# Patient Record
Sex: Male | Born: 1990 | Race: Black or African American | Hispanic: No | Marital: Single
Health system: Southern US, Community
[De-identification: ages and names within clinical notes are randomized; demographics above are authoritative.]

---
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
## 2009-01-15 ENCOUNTER — Emergency Department (HOSPITAL_COMMUNITY): Admission: EM | Admit: 2009-01-15 | Discharge: 2009-01-15 | Payer: Self-pay | Admitting: Family Medicine

## 2009-10-28 ENCOUNTER — Emergency Department (HOSPITAL_COMMUNITY): Admission: EM | Admit: 2009-10-28 | Discharge: 2009-10-28 | Payer: Self-pay | Admitting: Family Medicine

## 2011-08-16 ENCOUNTER — Encounter (HOSPITAL_COMMUNITY): Payer: Self-pay | Admitting: Emergency Medicine

## 2011-08-16 ENCOUNTER — Emergency Department (HOSPITAL_COMMUNITY): Payer: Self-pay

## 2011-08-16 ENCOUNTER — Inpatient Hospital Stay (HOSPITAL_COMMUNITY)
Admission: EM | Admit: 2011-08-16 | Discharge: 2011-08-19 | DRG: 087 | Disposition: A | Discharge status: Home / Self Care | Payer: Self-pay | Attending: Neurosurgery | Admitting: Neurosurgery

## 2011-08-16 DIAGNOSIS — S065X9A Traumatic subdural hemorrhage with loss of consciousness of unspecified duration, initial encounter: Secondary | ICD-10-CM

## 2011-08-16 DIAGNOSIS — S0280XA Fracture of other specified skull and facial bones, unspecified side, initial encounter for closed fracture: Principal | ICD-10-CM | POA: Diagnosis present

## 2011-08-16 DIAGNOSIS — W19XXXA Unspecified fall, initial encounter: Secondary | ICD-10-CM | POA: Diagnosis present

## 2011-08-16 DIAGNOSIS — S0291XA Unspecified fracture of skull, initial encounter for closed fracture: Secondary | ICD-10-CM

## 2011-08-16 DIAGNOSIS — F10929 Alcohol use, unspecified with intoxication, unspecified: Secondary | ICD-10-CM

## 2011-08-16 DIAGNOSIS — S064X9A Epidural hemorrhage with loss of consciousness of unspecified duration, initial encounter: Secondary | ICD-10-CM

## 2011-08-16 LAB — SAMPLE TO BLOOD BANK

## 2011-08-16 LAB — BASIC METABOLIC PANEL
BUN: 12 mg/dL (ref 6–23)
GFR calc non Af Amer: 80 mL/min — ABNORMAL LOW (ref >90)
Glucose, Bld: 85 mg/dL (ref 70–99)

## 2011-08-16 LAB — CBC
Hemoglobin: 15.3 g/dL (ref 13.0–17.0)
MCH: 27.5 pg (ref 26.0–34.0)
MCHC: 33.8 g/dL (ref 30.0–36.0)
MCV: 81.5 fL (ref 78.0–100.0)
Platelets: 237 10*3/uL (ref 150–400)
RBC: 5.56 MIL/uL (ref 4.22–5.81)
RDW: 13.1 % (ref 11.5–15.5)
WBC: 8.9 10*3/uL (ref 4.0–10.5)

## 2011-08-16 LAB — APTT: aPTT: 25 seconds (ref 24–37)

## 2011-08-16 MED ORDER — MORPHINE SULFATE 2 MG/ML IJ SOLN
2.0000 mg | INTRAMUSCULAR | Status: DC | PRN
  Administered 2011-08-17 – 2011-08-19 (×10): 2 mg via INTRAVENOUS
  Filled 2011-08-16 (×10): qty 1

## 2011-08-16 MED ORDER — ONDANSETRON HCL 4 MG PO TABS: 4.0000 mg | ORAL_TABLET | Freq: Four times a day (QID) | ORAL | Status: DC | PRN

## 2011-08-16 MED ORDER — ONDANSETRON HCL 4 MG/2ML IJ SOLN
4.0000 mg | Freq: Four times a day (QID) | INTRAMUSCULAR | Status: DC | PRN
  Administered 2011-08-17 – 2011-08-18 (×2): 4 mg via INTRAVENOUS
  Filled 2011-08-16 (×2): qty 2

## 2011-08-16 MED ORDER — POTASSIUM CHLORIDE IN NACL 20-0.9 MEQ/L-% IV SOLN
INTRAVENOUS | Status: DC
  Administered 2011-08-17 (×2): via INTRAVENOUS
  Filled 2011-08-16: qty 1000

## 2011-08-16 MED ORDER — HYDROCODONE-ACETAMINOPHEN 5-325 MG PO TABS
0.5000 | ORAL_TABLET | ORAL | Status: DC | PRN
  Administered 2011-08-18: 0.5 via ORAL
  Filled 2011-08-16: qty 1

## 2011-08-16 NOTE — ED Notes (Signed)
HOB elevated 45 degrees

## 2011-08-16 NOTE — ED Notes (Signed)
Dr.Cram in to assess

## 2011-08-16 NOTE — ED Notes (Signed)
PA in to reassess; EMS c-collar removed per PA

## 2011-08-16 NOTE — ED Notes (Signed)
Pt. To CT

## 2011-08-16 NOTE — ED Notes (Signed)
Resting quietly on stretcher with eyes closed; opens eyes to name call, mumbles incoherently; PERRLA 3/2; skin dark, warm, dry; ccm showing SR rate 80 without ectopy; EMS collar remains intact; pt remains in supine position; awaiting re-eval by MD; neuro sx has been consulted

## 2011-08-16 NOTE — H&P (Signed)
Adam Booker is an 21 y.o. male.   Chief Complaint: Close injury HPI: Patient is 21 year old gentleman who apparently was intoxicated and fell area unknown LOC patient is mildly amnestic of the event currently not complaining of pain anywhere else in his body.  History reviewed. No pertinent past medical history.  History reviewed. No pertinent past surgical history.  History reviewed. No pertinent family history. Social History:  does not have a smoking history on file. He does not have any smokeless tobacco history on file. His alcohol and drug histories not on file.  Allergies: No Known Allergies   (Not in a hospital admission)  Results for orders placed during the hospital encounter of 08/16/11 (from the past 48 hour(s))  CBC     Status: Normal   Collection Time   08/16/11  9:58 PM      Component Value Range Comment   WBC 8.9  4.0 - 10.5 (K/uL)    RBC 5.56  4.22 - 5.81 (MIL/uL)    Hemoglobin 15.3  13.0 - 17.0 (g/dL)    HCT 09.8  11.9 - 14.7 (%)    MCV 81.5  78.0 - 100.0 (fL)    MCH 27.5  26.0 - 34.0 (pg)    MCHC 33.8  30.0 - 36.0 (g/dL)    RDW 82.9  56.2 - 13.0 (%)    Platelets 237  150 - 400 (K/uL)   BASIC METABOLIC PANEL     Status: Abnormal   Collection Time   08/16/11  9:58 PM      Component Value Range Comment   Sodium 140  135 - 145 (mEq/L)    Potassium 3.5  3.5 - 5.1 (mEq/L)    Chloride 103  96 - 112 (mEq/L)    CO2 25  19 - 32 (mEq/L)    Glucose, Bld 85  70 - 99 (mg/dL)    BUN 12  6 - 23 (mg/dL)    Creatinine, Ser 8.65  0.50 - 1.35 (mg/dL)    Calcium 9.8  8.4 - 10.5 (mg/dL)    GFR calc non Af Amer 80 (*) >90 (mL/min)    GFR calc Af Amer >90  >90 (mL/min)   ETHANOL     Status: Abnormal   Collection Time   08/16/11  9:58 PM      Component Value Range Comment   Alcohol, Ethyl (B) 180 (*) 0 - 11 (mg/dL)   SAMPLE TO BLOOD BANK     Status: Normal   Collection Time   08/16/11  9:59 PM      Component Value Range Comment   Blood Bank Specimen SAMPLE AVAILABLE  FOR TESTING      Sample Expiration 08/17/2011      Ct Head Wo Contrast  08/16/2011  *RADIOLOGY REPORT*  Clinical Data:  Fall, loss of consciousness.  CT HEAD WITHOUT CONTRAST CT CERVICAL SPINE WITHOUT CONTRAST  Technique:  Multidetector CT imaging of the head and cervical spine was performed following the standard protocol without intravenous contrast.  Multiplanar CT image reconstructions of the cervical spine were also generated.  Comparison:   None.  CT HEAD  Findings: There is a 6-7 mm thick right frontal extra-axial hyperdense collection of blood associated with a moderate sized right frontal scalp hematoma. There is a linear nondepressed skull fracture which extends from the midline posteriorly across the right frontal bone toward the coronal suture.  There is no midline shift. This extra-axial collection likely represents an acute epidural hematoma.  There is  no parenchymal hemorrhage.  More posteriorly there is a left peritentorial hyperdense fluid collection which extends slightly into the interhemispheric fissure representing a second extra-axial hematoma, likely a subdural hematoma.  Clear sinuses and mastoids.  No definite subarachnoid blood. Negative orbits.  IMPRESSION: And 6-7 mm thick right frontal extra-axial hyperdense collection associated with a linear non-depressed skull fracture, likely acute epidural hematoma.  Smaller left peritentorial hyperdense collection likely subdural hematoma.  Critical Value/emergent results were called by telephone at the time of interpretation on 08/16/2011  at 945 PM  to  Lorenz Coaster, who verbally acknowledged these results.  CT CERVICAL SPINE  Findings:  There is no visible cervical spine fracture or traumatic subluxation.  The alignment is anatomic.  There is no prevertebral soft tissue swelling or intraspinal hematoma.  Airway midline.  No neck masses.  Clear lung apices.  Incidental azygos lobe.  IMPRESSION:  Negative exam.  Original Report  Authenticated By: Elsie Stain, M.D.   Ct Cervical Spine Wo Contrast  08/16/2011  *RADIOLOGY REPORT*  Clinical Data:  Fall, loss of consciousness.  CT HEAD WITHOUT CONTRAST CT CERVICAL SPINE WITHOUT CONTRAST  Technique:  Multidetector CT imaging of the head and cervical spine was performed following the standard protocol without intravenous contrast.  Multiplanar CT image reconstructions of the cervical spine were also generated.  Comparison:   None.  CT HEAD  Findings: There is a 6-7 mm thick right frontal extra-axial hyperdense collection of blood associated with a moderate sized right frontal scalp hematoma. There is a linear nondepressed skull fracture which extends from the midline posteriorly across the right frontal bone toward the coronal suture.  There is no midline shift. This extra-axial collection likely represents an acute epidural hematoma.  There is no parenchymal hemorrhage.  More posteriorly there is a left peritentorial hyperdense fluid collection which extends slightly into the interhemispheric fissure representing a second extra-axial hematoma, likely a subdural hematoma.  Clear sinuses and mastoids.  No definite subarachnoid blood. Negative orbits.  IMPRESSION: And 6-7 mm thick right frontal extra-axial hyperdense collection associated with a linear non-depressed skull fracture, likely acute epidural hematoma.  Smaller left peritentorial hyperdense collection likely subdural hematoma.  Critical Value/emergent results were called by telephone at the time of interpretation on 08/16/2011  at 945 PM  to  Lorenz Coaster, who verbally acknowledged these results.  CT CERVICAL SPINE  Findings:  There is no visible cervical spine fracture or traumatic subluxation.  The alignment is anatomic.  There is no prevertebral soft tissue swelling or intraspinal hematoma.  Airway midline.  No neck masses.  Clear lung apices.  Incidental azygos lobe.  IMPRESSION:  Negative exam.  Original Report Authenticated  By: Elsie Stain, M.D.    Review of Systems  Constitutional: Negative.   Eyes: Negative.   Respiratory: Negative.   Cardiovascular: Negative.   Gastrointestinal: Negative.   Genitourinary: Negative.   Musculoskeletal: Negative.   Skin: Negative.   Neurological: Positive for headaches.  Endo/Heme/Allergies: Negative.   Psychiatric/Behavioral: Negative.     Blood pressure 99/64, temperature 98.4 F (36.9 C), temperature source Oral, resp. rate 20, SpO2 100.00%. Physical Exam  Constitutional: He appears well-developed and well-nourished. He appears lethargic.  Respiratory: Breath sounds normal.  GI: Bowel sounds are normal.  Neurological: He has normal strength and normal reflexes. He appears lethargic. GCS eye subscore is 4. GCS verbal subscore is 4. GCS motor subscore is 6.       Patient is intoxicated but easily arousable he does have  some confused speech he is able to remember some aspects of the events and is oriented to name and place pupils are equal round and reactive to light extraocular movements are intact Amarea [place] nerves are also otherwise intact strength is 5 out of 5 in his upper and lower extremities with no pronator drift. He does have a cephalhematoma on the right side frontal.     Assessment/Plan 21 year old with close injury mild nondisplaced skull fracture small underlying epidural right frontal this point is a small with no mass effect small amount of tentorial blood flow but the patient to the ICU plan a followup head CT about 4 or 5 hours we'll allow patient to sober up. Preliminarily cervical spine series negative.  Henley Blyth P 08/16/2011, 10:56 PM

## 2011-08-16 NOTE — ED Provider Notes (Addendum)
10:42 PM Discussed with Dr Wynetta Emery, neurosurg. Will be in to eval pt.   Medical screening examination/treatment/procedure(s) were conducted as a shared visit with non-physician practitioner(s) and myself.  I personally evaluated the patient during the encounter.  CRITICAL CARE Performed by: Raeford Razor   Total critical care time: 30 minutes  Critical care time was exclusive of separately billable procedures and treating other patients.  Critical care was necessary to treat or prevent imminent or life-threatening deterioration.  Critical care was time spent personally by me on the following activities: development of treatment plan with patient and/or surrogate as well as nursing, discussions with consultants, evaluation of patient's response to treatment, examination of patient, obtaining history from patient or surrogate, ordering and performing treatments and interventions, ordering and review of laboratory studies, ordering and review of radiographic studies, pulse oximetry and re-evaluation of patient's condition.  21 year old male brought in after mechanical fall. Patient was intoxicated and tripped from standing height. He struck his head against the ground. Patient is intoxicated and history is somewhat unreliable. Loss of consciousness. Workup significant for a nondisplaced, nondepressed skull fracture with an associated small epidural hematoma. There is also a small left-sided tentorial subdural hematoma. No shift. Small superificial abrasion to dorsum R hand without bony tenderness.  Pt with no significant PMHx and not on blood thinners. Patient is intoxicated and agitated, but has a nonfocal neurological examination otherwise.Lungs clear and breath sounds symmetric. Abdomen benign. No midline spinal tenderness. No tenderness or chest wall or extremities or pain with ROM. CT of the C-spine was negative. Given intoxication c-collar is left in place. Does not appear to be any other  associated emergent injuries. Neurosurgery was surgery was consulted. Admission.  Diagnoses: 1. Skull fracture 2. Epidural hematoma 3. Subdural hematoma 4. Alcohol intoxication  Raeford Razor, MD 08/16/11 1610  Raeford Razor, MD 08/16/11 [phone]

## 2011-08-16 NOTE — ED Notes (Signed)
Pt. Arrived by ems. They report that he fell from a curb and hit his head. He has a knot on the right side of his forehead and and abrasion/knot on the back/side of the head. Pt. Doesn;t remember what happened and is intolxicated. He is very agitated and yelling at staff. Pt. Has been jerking at the c-collar trying to remove it stating he wants to "get out of here".

## 2011-08-16 NOTE — ED Provider Notes (Signed)
History     CSN: [id number]  Arrival date & time 08/16/11  2058   First MD Initiated Contact with Patient 08/16/11 2111      Chief Complaint  Patient presents with  . Fall    mechanical fall from concrete curbing; (+) LOC; no complaints of pain at this time; hematoma to rgt forehead; abrasion to rgt temporal/parietal area; smells of ETOH         (Consider location/radiation/quality/duration/timing/severity/associated sxs/prior treatment) The history is provided by the patient and a relative. The history is limited by the condition of the patient (intoxication, agitation).  21 y/o M with no PMH who presents to ED with c/c of head injury that occurred just PTA this evening. Unreliable historian. Pt admits to EtOH consumption, tripped over a curb and fell to the ground, striking head. +LOC, unknown duration. Also reports hitting head on a door earlier this evening. Currently with c/o only mild HA. Denies visual disturbance, neck pain, weakness.numbness to extremities. Unknown if pt ambulated after fall. Tx PTA with full spinal immobilization.  History reviewed. No pertinent past medical history.  History reviewed. No pertinent past surgical history.  History reviewed. No pertinent family history.  History  Substance Use Topics  . Smoking status: Not on file  . Smokeless tobacco: Not on file  . Alcohol Use: Not on file      Review of Systems  Unable to perform ROS: Other  Intoxicated, does not willingly answer all questions.   Allergies  Review of patient's allergies indicates no known allergies.  Home Medications  No current outpatient prescriptions on file.  BP 103/63  Temp(Src) 98.7 F (37.1 C) (Oral)  Resp 20  SpO2 100%  Physical Exam  Nursing note and vitals reviewed. Constitutional: He is oriented to person, place, and time. He appears well-developed and well-nourished.       Agitated  HENT:  Head: Normocephalic.    Right Ear: External ear normal.  Left  Ear: External ear normal.  Mouth/Throat: Oropharynx is clear and moist.       Tongue midline  Eyes: Conjunctivae and EOM are normal. Pupils are equal, round, and reactive to light.  Neck: Muscular tenderness present. No spinous process tenderness present.       C_spine immobilization in place, not removed.  Cardiovascular: Normal rate, regular rhythm and normal heart sounds.        Bilateral radial and DP pulses are 2+   Pulmonary/Chest: Effort normal and breath sounds normal. No respiratory distress. He has no wheezes. He exhibits no tenderness.  Abdominal: Soft. Bowel sounds are normal. He exhibits no distension. There is no tenderness.  Musculoskeletal: He exhibits no edema. Tenderness: only to paracervical region bilaterally.       Full ROM all extremities. CTLS spine without midline pain, stepoff, deformity.  Neurological: He is alert and oriented to person, place, and time. He has normal strength. Cranial nerve deficit: 3-12 intact. Sensory deficit: intact to light touch in all extremities distally. Abnormal coordination: F-n itnact bilaterally. Abnormal gait: not tested. GCS eye subscore is 4. GCS verbal subscore is 5. GCS motor subscore is 6.  Skin: Skin is warm and dry.       See HENT exam. Otherwise with small abrasion to dorsum of right hand, no active bleeding.    ED Course  Procedures (including critical care time)   Labs Reviewed  CBC  SAMPLE TO BLOOD BANK  BASIC METABOLIC PANEL  ETHANOL  URINALYSIS, ROUTINE W REFLEX MICROSCOPIC  URINE  RAPID DRUG SCREEN (HOSP PERFORMED)  PROTIME-INR  APTT   Ct Head Wo Contrast  08/16/2011  *RADIOLOGY REPORT*  Clinical Data:  Fall, loss of consciousness.  CT HEAD WITHOUT CONTRAST CT CERVICAL SPINE WITHOUT CONTRAST  Technique:  Multidetector CT imaging of the head and cervical spine was performed following the standard protocol without intravenous contrast.  Multiplanar CT image reconstructions of the cervical spine were also generated.   Comparison:   None.  CT HEAD  Findings: There is a 6-7 mm thick right frontal extra-axial hyperdense collection of blood associated with a moderate sized right frontal scalp hematoma. There is a linear nondepressed skull fracture which extends from the midline posteriorly across the right frontal bone toward the coronal suture.  There is no midline shift. This extra-axial collection likely represents an acute epidural hematoma.  There is no parenchymal hemorrhage.  More posteriorly there is a left peritentorial hyperdense fluid collection which extends slightly into the interhemispheric fissure representing a second extra-axial hematoma, likely a subdural hematoma.  Clear sinuses and mastoids.  No definite subarachnoid blood. Negative orbits.  IMPRESSION: And 6-7 mm thick right frontal extra-axial hyperdense collection associated with a linear non-depressed skull fracture, likely acute epidural hematoma.  Smaller left peritentorial hyperdense collection likely subdural hematoma.  Critical Value/emergent results were called by telephone at the time of interpretation on 08/16/2011  at 945 PM  to  Lorenz Coaster, who verbally acknowledged these results.  CT CERVICAL SPINE  Findings:  There is no visible cervical spine fracture or traumatic subluxation.  The alignment is anatomic.  There is no prevertebral soft tissue swelling or intraspinal hematoma.  Airway midline.  No neck masses.  Clear lung apices.  Incidental azygos lobe.  IMPRESSION:  Negative exam.  Original Report Authenticated By: Elsie Stain, M.D.   Ct Cervical Spine Wo Contrast  08/16/2011  *RADIOLOGY REPORT*  Clinical Data:  Fall, loss of consciousness.  CT HEAD WITHOUT CONTRAST CT CERVICAL SPINE WITHOUT CONTRAST  Technique:  Multidetector CT imaging of the head and cervical spine was performed following the standard protocol without intravenous contrast.  Multiplanar CT image reconstructions of the cervical spine were also generated.  Comparison:    None.  CT HEAD  Findings: There is a 6-7 mm thick right frontal extra-axial hyperdense collection of blood associated with a moderate sized right frontal scalp hematoma. There is a linear nondepressed skull fracture which extends from the midline posteriorly across the right frontal bone toward the coronal suture.  There is no midline shift. This extra-axial collection likely represents an acute epidural hematoma.  There is no parenchymal hemorrhage.  More posteriorly there is a left peritentorial hyperdense fluid collection which extends slightly into the interhemispheric fissure representing a second extra-axial hematoma, likely a subdural hematoma.  Clear sinuses and mastoids.  No definite subarachnoid blood. Negative orbits.  IMPRESSION: And 6-7 mm thick right frontal extra-axial hyperdense collection associated with a linear non-depressed skull fracture, likely acute epidural hematoma.  Smaller left peritentorial hyperdense collection likely subdural hematoma.  Critical Value/emergent results were called by telephone at the time of interpretation on 08/16/2011  at 945 PM  to  Lorenz Coaster, who verbally acknowledged these results.  CT CERVICAL SPINE  Findings:  There is no visible cervical spine fracture or traumatic subluxation.  The alignment is anatomic.  There is no prevertebral soft tissue swelling or intraspinal hematoma.  Airway midline.  No neck masses.  Clear lung apices.  Incidental azygos lobe.  IMPRESSION:  Negative exam.  Original Report Authenticated By: Elsie Stain, M.D.     1. Fall   2. Skull fracture   3. Epidural hematoma   4. Subdural hematoma   5. Alcohol intoxication       MDM  Mechanical fall, head injury, alcohol intoxication. CT head reviewed, discussed with radiologist, sig for non-depressed, non-displaced skull fracture with assoc epidural hematoma, also small left-sided tentorial subdural hematoma. No midline shift. Non-focal neuro exam. Neurosurgery consulted to  eval pt in ED for admission, spoke with attending MD.        Shaaron Adler, PA-C 08/18/11 0113

## 2011-08-17 ENCOUNTER — Encounter (HOSPITAL_COMMUNITY): Payer: Self-pay | Admitting: *Deleted

## 2011-08-17 ENCOUNTER — Inpatient Hospital Stay (HOSPITAL_COMMUNITY): Payer: Self-pay

## 2011-08-17 LAB — MRSA PCR SCREENING: MRSA by PCR: NEGATIVE

## 2011-08-17 LAB — URINALYSIS, ROUTINE W REFLEX MICROSCOPIC
Hgb urine dipstick: NEGATIVE
Ketones, ur: NEGATIVE mg/dL
Leukocytes, UA: NEGATIVE
Protein, ur: NEGATIVE mg/dL
Specific Gravity, Urine: 1.012 (ref 1.005–1.030)
Urobilinogen, UA: 0.2 mg/dL (ref 0.0–1.0)
pH: 6.5 (ref 5.0–8.0)

## 2011-08-17 LAB — RAPID URINE DRUG SCREEN, HOSP PERFORMED: Amphetamines: NOT DETECTED

## 2011-08-17 NOTE — ED Notes (Signed)
Care handed off to ICU RN; ICU RN made aware of episode of emesis and unequal pupils; also notified that Dr.Cram had been paged

## 2011-08-17 NOTE — Care Management Note (Signed)
    Page 1 of 1   08/17/2011     11:35:55 AM   CARE MANAGEMENT NOTE 08/17/2011  Patient:  NIRAJ, KUDRNA   Account Number:  [id number]  Date Initiated:  08/17/2011  Documentation initiated by:  Onnie Boer  Subjective/Objective Assessment:   PT WAS ADMITTED WITH A HEMATOMA FROM A FALL     Action/Plan:   PROGRESSION OF CARE AND DISCHARGE PLANNING   Anticipated DC Date:  08/19/2011   Anticipated DC Plan:  HOME/SELF CARE  In-house referral  Clinical Social Worker      DC Planning Services  CM consult      Choice offered to / List presented to:             Status of service:  In process, will continue to follow Medicare Important Message given?   (If response is "NO", the following Medicare IM given date fields will be blank) Date Medicare IM given:   Date Additional Medicare IM given:    Discharge Disposition:    Per UR Regulation:  Reviewed for med. necessity/level of care/duration of stay  If discussed at Long Length of Stay Meetings, dates discussed:    Comments:  08/17/11 Onnie Boer, RN, BSN 1134 PT WAS ADMITTED WITH A FALL AND A HEMATOMA.  PTA PT WAS DRINKING.  PTA PT WAS AT HOME WITH SELF CARE.  WILL F/U ON DC PLANNING

## 2011-08-17 NOTE — ED Notes (Addendum)
Pt vomited large amt of partially digested food; remains groggy, will open eyes to name call and respond verbally as appropriate; pupils now unequal L>R (left pupil 4/2, rgt pupil 3/2); Dr.Cram paged at this time

## 2011-08-17 NOTE — ED Notes (Addendum)
Spoke with Dr.Cram - notified of episode of emesis and unequal pupils prior to transporting to neuro ICU; states to repeat head CT scan now; neuro ICU staff notified of same; repeat head CT ordered

## 2011-08-17 NOTE — Progress Notes (Signed)
Subjective: Patient reports The patient feels better although he still has a headache  Objective: Vital signs in last 24 hours: Temp:  [98.4 F (36.9 C)-99 F (37.2 C)] 98.9 F (37.2 C) (05/28 0400) Pulse Rate:  [60-87] 70  (05/28 0600) Resp:  [10-20] 18  (05/28 0700) BP: (65-128)/(27-86) 95/42 mmHg (05/28 0700) SpO2:  [98 %-100 %] 99 % (05/28 0600) Weight:  [61.9 kg (136 lb 7.4 oz)] 61.9 kg (136 lb 7.4 oz) (05/28 0000)  Intake/Output from previous day: 05/27 0701 - 05/28 0700 In: 357.5 [I.V.:357.5] Out: 700 [Urine:700] Intake/Output this shift:    awake alert and oriented strength 5 out of 5 no pronator drift  Lab Results:  [facility] 08/16/11 2158  WBC 8.9  HGB 15.3  HCT 45.3  PLT 237   BMET  Basename 08/16/11 2158  NA 140  K 3.5  CL 103  CO2 25  GLUCOSE 85  BUN 12  CREATININE 1.26  CALCIUM 9.8    Studies/Results: Ct Head Wo Contrast  08/17/2011  *RADIOLOGY REPORT*  Clinical Data: Follow-up trauma.  Changing neurological status.  CT HEAD WITHOUT CONTRAST  Technique:  Contiguous axial images were obtained from the base of the skull through the vertex without contrast.  Comparison: 08/16/2011  Findings: The technically limited study due to motion artifact. Again demonstrated is an elliptical extra-axial hematoma in the right anterior frontal region measuring 5 mm depth consistent with an epidural hematoma.  Increased density along the left tentorial region is unchanged.  Ventricles and sulci remain symmetrical.  No significant mass effect or midline shift.  No ventricular dilatation.  Subcutaneous scalp hematoma in the right frontal region again demonstrated with nondisplaced right frontal skull fracture.  Nasal bone fractures of indeterminate age.  IMPRESSION: No significant change since previous study.  Again demonstrated is a right anterior frontal epidural hematoma with nondisplaced right frontal skull fracture, subcutaneous scalp hematoma, and increased density  suggesting hematoma along the left tentorium.  No evidence of developing mass effect or midline shift.  Original Report Authenticated By: Marlon Pel, M.D.   Ct Head Wo Contrast  08/16/2011  *RADIOLOGY REPORT*  Clinical Data:  Fall, loss of consciousness.  CT HEAD WITHOUT CONTRAST CT CERVICAL SPINE WITHOUT CONTRAST  Technique:  Multidetector CT imaging of the head and cervical spine was performed following the standard protocol without intravenous contrast.  Multiplanar CT image reconstructions of the cervical spine were also generated.  Comparison:   None.  CT HEAD  Findings: There is a 6-7 mm thick right frontal extra-axial hyperdense collection of blood associated with a moderate sized right frontal scalp hematoma. There is a linear nondepressed skull fracture which extends from the midline posteriorly across the right frontal bone toward the coronal suture.  There is no midline shift. This extra-axial collection likely represents an acute epidural hematoma.  There is no parenchymal hemorrhage.  More posteriorly there is a left peritentorial hyperdense fluid collection which extends slightly into the interhemispheric fissure representing a second extra-axial hematoma, likely a subdural hematoma.  Clear sinuses and mastoids.  No definite subarachnoid blood. Negative orbits.  IMPRESSION: And 6-7 mm thick right frontal extra-axial hyperdense collection associated with a linear non-depressed skull fracture, likely acute epidural hematoma.  Smaller left peritentorial hyperdense collection likely subdural hematoma.  Critical Value/emergent results were called by telephone at the time of interpretation on 08/16/2011  at 945 PM  to  Lorenz Coaster, who verbally acknowledged these results.  CT CERVICAL SPINE  Findings:  There is  no visible cervical spine fracture or traumatic subluxation.  The alignment is anatomic.  There is no prevertebral soft tissue swelling or intraspinal hematoma.  Airway midline.  No  neck masses.  Clear lung apices.  Incidental azygos lobe.  IMPRESSION:  Negative exam.  Original Report Authenticated By: Elsie Stain, M.D.   Ct Cervical Spine Wo Contrast  08/16/2011  *RADIOLOGY REPORT*  Clinical Data:  Fall, loss of consciousness.  CT HEAD WITHOUT CONTRAST CT CERVICAL SPINE WITHOUT CONTRAST  Technique:  Multidetector CT imaging of the head and cervical spine was performed following the standard protocol without intravenous contrast.  Multiplanar CT image reconstructions of the cervical spine were also generated.  Comparison:   None.  CT HEAD  Findings: There is a 6-7 mm thick right frontal extra-axial hyperdense collection of blood associated with a moderate sized right frontal scalp hematoma. There is a linear nondepressed skull fracture which extends from the midline posteriorly across the right frontal bone toward the coronal suture.  There is no midline shift. This extra-axial collection likely represents an acute epidural hematoma.  There is no parenchymal hemorrhage.  More posteriorly there is a left peritentorial hyperdense fluid collection which extends slightly into the interhemispheric fissure representing a second extra-axial hematoma, likely a subdural hematoma.  Clear sinuses and mastoids.  No definite subarachnoid blood. Negative orbits.  IMPRESSION: And 6-7 mm thick right frontal extra-axial hyperdense collection associated with a linear non-depressed skull fracture, likely acute epidural hematoma.  Smaller left peritentorial hyperdense collection likely subdural hematoma.  Critical Value/emergent results were called by telephone at the time of interpretation on 08/16/2011  at 945 PM  to  Lorenz Coaster, who verbally acknowledged these results.  CT CERVICAL SPINE  Findings:  There is no visible cervical spine fracture or traumatic subluxation.  The alignment is anatomic.  There is no prevertebral soft tissue swelling or intraspinal hematoma.  Airway midline.  No neck masses.   Clear lung apices.  Incidental azygos lobe.  IMPRESSION:  Negative exam.  Original Report Authenticated By: Elsie Stain, M.D.    Assessment/Plan: Hospital day 1 small epidural skull fracture transfer the floor continue mobilizes possible discharge tomorrow follow up CT stable  LOS: 1 day     Chassie Pennix P 08/17/2011, 7:59 AM

## 2011-08-17 NOTE — Progress Notes (Signed)
Received patient to 3015. Pt. Alert and cooperative no complaints voiced.  Given clear liquids.

## 2011-08-18 NOTE — Progress Notes (Signed)
Subjective: Patient reports Overall is doing well he still is headache expressed with nausea throughout this morning has not been very much today  Objective: Vital signs in last 24 hours: Temp:  [98.1 F (36.7 C)-99.3 F (37.4 C)] 98.2 F (36.8 C) (05/29 1330) Pulse Rate:  [54-64] 54  (05/29 1330) Resp:  [16-18] 18  (05/29 1330) BP: (100-118)/(64-73) 102/72 mmHg (05/29 1330) SpO2:  [99 %-100 %] 99 % (05/29 1330)  Intake/Output from previous day: 05/28 0701 - 05/29 0700 In: 510 [P.O.:360; I.V.:150] Out: 350 [Urine:350] Intake/Output this shift: Total I/O In: 540 [P.O.:540] Out: -   awake alert oriented strength 5 out of 5 no pronator drift  Lab Results:  [facility] 08/16/11 2158  WBC 8.9  HGB 15.3  HCT 45.3  PLT 237   BMET  Basename 08/16/11 2158  NA 140  K 3.5  CL 103  CO2 25  GLUCOSE 85  BUN 12  CREATININE 1.26  CALCIUM 9.8    Studies/Results: Ct Head Wo Contrast  08/17/2011  *RADIOLOGY REPORT*  Clinical Data: Follow-up trauma.  Changing neurological status.  CT HEAD WITHOUT CONTRAST  Technique:  Contiguous axial images were obtained from the base of the skull through the vertex without contrast.  Comparison: 08/16/2011  Findings: The technically limited study due to motion artifact. Again demonstrated is an elliptical extra-axial hematoma in the right anterior frontal region measuring 5 mm depth consistent with an epidural hematoma.  Increased density along the left tentorial region is unchanged.  Ventricles and sulci remain symmetrical.  No significant mass effect or midline shift.  No ventricular dilatation.  Subcutaneous scalp hematoma in the right frontal region again demonstrated with nondisplaced right frontal skull fracture.  Nasal bone fractures of indeterminate age.  IMPRESSION: No significant change since previous study.  Again demonstrated is a right anterior frontal epidural hematoma with nondisplaced right frontal skull fracture, subcutaneous scalp  hematoma, and increased density suggesting hematoma along the left tentorium.  No evidence of developing mass effect or midline shift.  Original Report Authenticated By: Marlon Pel, M.D.   Ct Head Wo Contrast  08/16/2011  *RADIOLOGY REPORT*  Clinical Data:  Fall, loss of consciousness.  CT HEAD WITHOUT CONTRAST CT CERVICAL SPINE WITHOUT CONTRAST  Technique:  Multidetector CT imaging of the head and cervical spine was performed following the standard protocol without intravenous contrast.  Multiplanar CT image reconstructions of the cervical spine were also generated.  Comparison:   None.  CT HEAD  Findings: There is a 6-7 mm thick right frontal extra-axial hyperdense collection of blood associated with a moderate sized right frontal scalp hematoma. There is a linear nondepressed skull fracture which extends from the midline posteriorly across the right frontal bone toward the coronal suture.  There is no midline shift. This extra-axial collection likely represents an acute epidural hematoma.  There is no parenchymal hemorrhage.  More posteriorly there is a left peritentorial hyperdense fluid collection which extends slightly into the interhemispheric fissure representing a second extra-axial hematoma, likely a subdural hematoma.  Clear sinuses and mastoids.  No definite subarachnoid blood. Negative orbits.  IMPRESSION: And 6-7 mm thick right frontal extra-axial hyperdense collection associated with a linear non-depressed skull fracture, likely acute epidural hematoma.  Smaller left peritentorial hyperdense collection likely subdural hematoma.  Critical Value/emergent results were called by telephone at the time of interpretation on 08/16/2011  at 945 PM  to  Lorenz Coaster, who verbally acknowledged these results.  CT CERVICAL SPINE  Findings:  There is  no visible cervical spine fracture or traumatic subluxation.  The alignment is anatomic.  There is no prevertebral soft tissue swelling or intraspinal  hematoma.  Airway midline.  No neck masses.  Clear lung apices.  Incidental azygos lobe.  IMPRESSION:  Negative exam.  Original Report Authenticated By: Elsie Stain, M.D.   Ct Cervical Spine Wo Contrast  08/16/2011  *RADIOLOGY REPORT*  Clinical Data:  Fall, loss of consciousness.  CT HEAD WITHOUT CONTRAST CT CERVICAL SPINE WITHOUT CONTRAST  Technique:  Multidetector CT imaging of the head and cervical spine was performed following the standard protocol without intravenous contrast.  Multiplanar CT image reconstructions of the cervical spine were also generated.  Comparison:   None.  CT HEAD  Findings: There is a 6-7 mm thick right frontal extra-axial hyperdense collection of blood associated with a moderate sized right frontal scalp hematoma. There is a linear nondepressed skull fracture which extends from the midline posteriorly across the right frontal bone toward the coronal suture.  There is no midline shift. This extra-axial collection likely represents an acute epidural hematoma.  There is no parenchymal hemorrhage.  More posteriorly there is a left peritentorial hyperdense fluid collection which extends slightly into the interhemispheric fissure representing a second extra-axial hematoma, likely a subdural hematoma.  Clear sinuses and mastoids.  No definite subarachnoid blood. Negative orbits.  IMPRESSION: And 6-7 mm thick right frontal extra-axial hyperdense collection associated with a linear non-depressed skull fracture, likely acute epidural hematoma.  Smaller left peritentorial hyperdense collection likely subdural hematoma.  Critical Value/emergent results were called by telephone at the time of interpretation on 08/16/2011  at 945 PM  to  Lorenz Coaster, who verbally acknowledged these results.  CT CERVICAL SPINE  Findings:  There is no visible cervical spine fracture or traumatic subluxation.  The alignment is anatomic.  There is no prevertebral soft tissue swelling or intraspinal hematoma.   Airway midline.  No neck masses.  Clear lung apices.  Incidental azygos lobe.  IMPRESSION:  Negative exam.  Original Report Authenticated By: Elsie Stain, M.D.    Assessment/Plan: Post injury day 2 for skull fracture mom mild epidural still postconcussive some nausea not tolerating by mouth yet we'll continue in the hospital until he is tolerating by mouth without any nausea  LOS: 2 days     Dametra Whetsel P 08/18/2011, 3:33 PM

## 2011-08-19 ENCOUNTER — Encounter (HOSPITAL_COMMUNITY): Payer: Self-pay | Admitting: Radiology

## 2011-08-19 ENCOUNTER — Inpatient Hospital Stay (HOSPITAL_COMMUNITY): Payer: Self-pay

## 2011-08-19 NOTE — Progress Notes (Signed)
Subjective: Patient reports Patient is doing well headache or nausea  Objective: Vital signs in last 24 hours: Temp:  [97.5 F (36.4 C)-98.8 F (37.1 C)] 97.5 F (36.4 C) (05/30 0600) Pulse Rate:  [46-59] 46  (05/30 0600) Resp:  [16-18] 16  (05/30 0600) BP: (102-123)/(70-76) 121/75 mmHg (05/30 0600) SpO2:  [99 %-100 %] 100 % (05/30 0600)  Intake/Output from previous day: 05/29 0701 - 05/30 0700 In: 540 [P.O.:540] Out: 400 [Urine:400] Intake/Output this shift:    Strength out of 5 awake alert and oriented  Lab Results:  [facility] 08/16/11 2158  WBC 8.9  HGB 15.3  HCT 45.3  PLT 237   BMET  Basename 08/16/11 2158  NA 140  K 3.5  CL 103  CO2 25  GLUCOSE 85  BUN 12  CREATININE 1.26  CALCIUM 9.8    Studies/Results: Ct Head Wo Contrast  08/19/2011  *RADIOLOGY REPORT*  Clinical Data: Follow-up to previous study.  No acute changes.  CT HEAD WITHOUT CONTRAST  Technique:  Contiguous axial images were obtained from the base of the skull through the vertex without contrast.  Comparison: 08/17/2011  Findings: Small right anterior frontal epidural hematoma again demonstrated, now measuring about 4 ml depth.  This is decreasing in density, consistent with typical evolution.  No significant progression or enlargement. Increased density along the left tentorium is less prominent.  There is suggestion of focal developing low attenuation in the left posterior temporal region which might represent a site of contra coup venous sheer injury. No significant mass effect or midline shift.  Gray-white matter junctions are distinct.  Ventricles are not dilated.  IMPRESSION: Small right frontal epidural hematoma without significant progression.  Left.  Tentorial hemorrhage is less prominent.  There is suggestion of focal developing low attenuation in the posterior left temporal lobe which might represent a contrecoup venous sheer injury. No significant mass effect or midline shift.  Original Report  Authenticated By: Marlon Pel, M.D.    Assessment/Plan: Discharge  LOS: 3 days     Lekeisha Arenas P 08/19/2011, 9:40 AM

## 2011-08-19 NOTE — Discharge Summary (Signed)
  Physician Discharge Summary  Patient ID: Adam Booker MRN: [id number] DOB/AGE: 23-Dec-1990 21 y.o.  Admit date: 08/16/2011 Discharge date: 08/19/2011  Admission Diagnoses: Skull fracture epidural hematoma  Discharge Diagnoses: Same Active Problems:  * No active hospital problems. *    Discharged Condition: good  Hospital Course: Patient was admitted hospital from the emergency department was noted to have a right frontal skull fracture and a small underlying epidural patient was admitted and observed in the ICU overnight followed scan was stable patient the floor over the next only is in the floor a little bit of trouble with nausea and keep n.p.o. down but by hospital day 3 he was awake alert oriented followup CT scan stable he was tolerating her diet without nausea and headaches were manageable.  Consults: Significant Diagnostic Studies: Treatments: Discharge Exam: Blood pressure 116/75, pulse 59, temperature 98.2 F (36.8 C), temperature source Oral, resp. rate 18, height 5\' 6"  (1.676 m), weight 61.9 kg (136 lb 7.4 oz), SpO2 99.00%. Awake alert oriented strength 5 out of 5  Disposition: Home   Medication List    Notice       You have not been prescribed any medications.              Signed: Ayana Imhof P 08/19/2011, 9:51 AM

## 2011-08-19 NOTE — Discharge Instructions (Signed)
No lifting no twisting no bending no  driving or riding a car until he comes back and forth to see me

## 2011-08-25 NOTE — ED Provider Notes (Signed)
Medical screening examination/treatment/procedure(s) were performed by non-physician practitioner and as supervising physician I was immediately available for consultation/collaboration.  Raeford Razor, MD 08/25/11 2248

## 2012-11-27 ENCOUNTER — Encounter (HOSPITAL_COMMUNITY): Payer: Self-pay | Admitting: Emergency Medicine

## 2012-11-27 ENCOUNTER — Emergency Department (HOSPITAL_COMMUNITY)
Admission: EM | Admit: 2012-11-27 | Discharge: 2012-11-27 | Disposition: A | Discharge status: Home / Self Care | Payer: Self-pay | Attending: Emergency Medicine | Admitting: Emergency Medicine

## 2012-11-27 ENCOUNTER — Emergency Department (HOSPITAL_COMMUNITY): Payer: Self-pay

## 2012-11-27 DIAGNOSIS — W108XXA Fall (on) (from) other stairs and steps, initial encounter: Secondary | ICD-10-CM | POA: Insufficient documentation

## 2012-11-27 DIAGNOSIS — F17200 Nicotine dependence, unspecified, uncomplicated: Secondary | ICD-10-CM | POA: Insufficient documentation

## 2012-11-27 DIAGNOSIS — Y929 Unspecified place or not applicable: Secondary | ICD-10-CM | POA: Insufficient documentation

## 2012-11-27 DIAGNOSIS — Y939 Activity, unspecified: Secondary | ICD-10-CM | POA: Insufficient documentation

## 2012-11-27 DIAGNOSIS — S51009A Unspecified open wound of unspecified elbow, initial encounter: Secondary | ICD-10-CM | POA: Insufficient documentation

## 2012-11-27 DIAGNOSIS — S51011A Laceration without foreign body of right elbow, initial encounter: Secondary | ICD-10-CM

## 2012-11-27 NOTE — ED Provider Notes (Signed)
CSN: [id number]     Arrival date & time 11/27/12  4098 History   First MD Initiated Contact with Patient 11/27/12 [phone]     Chief Complaint  Patient presents with  . Extremity Laceration    right elbow   (Consider location/radiation/quality/duration/timing/severity/associated sxs/prior Treatment) HPI  Adam Booker is a 22 y.o.male without any significant PMH presents to the ER with complaints of right elbow laceration after falling this morning. Pt smells of ETOH but is not acting intoxicated. He reports that the accident happened at 3 or 4 am  He slipped down the stairs "trying to make it home". He denies injury to head or neck. He denies being unable to move arms or legs. He reports his last tetanus was 3 or 4 years ago.    History reviewed. No pertinent past medical history. History reviewed. No pertinent past surgical history. No family history on file. History  Substance Use Topics  . Smoking status: Smoker, Current Status Unknown    Types: Cigarettes  . Smokeless tobacco: Not on file  . Alcohol Use: 1.5 oz/week    3 drink(s) per week    Review of Systems ROS is negative unless otherwise stated in the HPI   Allergies  Review of patient's allergies indicates no known allergies.  Home Medications  No current outpatient prescriptions on file. BP 100/58  Pulse 83  Temp(Src) 98.5 F (36.9 C) (Oral)  Resp 18  Ht 5' 5.5" (1.664 m)  Wt 135 lb (61.236 kg)  BMI 22.12 kg/m2  SpO2 98% Physical Exam  Nursing note and vitals reviewed. Constitutional: He appears well-developed and well-nourished. No distress.  HENT:  Head: Normocephalic and atraumatic.  Eyes: Pupils are equal, round, and reactive to light.  Neck: Normal range of motion. Neck supple.  Cardiovascular: Normal rate and regular rhythm.   Pulmonary/Chest: Effort normal.  Abdominal: Soft.  Musculoskeletal:       Left forearm: He exhibits laceration (wound is not actively bleeding).       Arms: Neurological:  He is alert.  Skin: Skin is warm and dry.    ED Course  Procedures (including critical care time) Labs Review Labs Reviewed - No data to display Imaging Review Dg Elbow Complete Right  11/27/2012   *RADIOLOGY REPORT*  Clinical Data: Laceration  RIGHT ELBOW - COMPLETE 3+ VIEW  Comparison: None.  Findings: Soft tissue defect noted consistent with laceration superficially.  No underlying radiopaque foreign body.  No fracture.  Negative for effusion.  IMPRESSION: Superficial soft tissue laceration.  No other acute finding.   Original Report Authenticated By: Judie Petit. Miles Costain, M.D.    MDM   1. Elbow laceration, right, initial encounter      Xray shows no boney involvement.   LACERATION REPAIR Performed by: Dorthula Matas Authorized by: Dorthula Matas Consent: Verbal consent obtained. Risks and benefits: risks, benefits and alternatives were discussed Consent given by: patient Patient identity confirmed: provided demographic data Prepped and Draped in normal sterile fashion Wound explored  Laceration Location: right posterior forearm  Laceration Length: 6 cm  No Foreign Bodies seen or palpated  Anesthesia: local infiltration  Local anesthetic: lidocaine 2% wo epinephrine  Anesthetic total: 5 ml  Irrigation method: syringe Amount of cleaning: standard  Skin closure: sutures  Number of sutures: 6  Technique: simple interrupted    Patient tolerance: Patient tolerated the procedure well with no immediate complications.  Pt sleeping, woke up easily with ammonia.  22 y.o.Adam Booker's evaluation in the Emergency Department  is complete. It has been determined that no acute conditions requiring further emergency intervention are present at this time. The patient/guardian have been advised of the diagnosis and plan. We have discussed signs and symptoms that warrant return to the ED, such as changes or worsening in symptoms.  Vital signs are stable at discharge. Filed  Vitals:   11/27/12 0830  BP: 100/58  Pulse: 83  Temp:   Resp:     Patient/guardian has voiced understanding and agreed to follow-up with the PCP or specialist.    Dorthula Matas, PA-C 11/27/12 1017

## 2012-11-27 NOTE — ED Notes (Addendum)
Pt reports going down stairs and slipped about 3 or 4 O'clock this morning. Pt presents with laceration to right elbow area, bleeding controlled. Pt able to move all extremities. Pt denies + LOC.

## 2012-11-28 NOTE — ED Provider Notes (Signed)
Medical screening examination/treatment/procedure(s) were performed by non-physician practitioner and as supervising physician I was immediately available for consultation/collaboration.  Darlys Gales, MD 11/28/12 [phone]

## 2014-02-03 IMAGING — CR DG ELBOW COMPLETE 3+V*R*
4 series · 4 of 4 positions shown · non-contrast
Comparison: None.

CLINICAL DATA: Laceration

RIGHT ELBOW - COMPLETE 3+ VIEW

[x elbow obl right (1 of 3)]
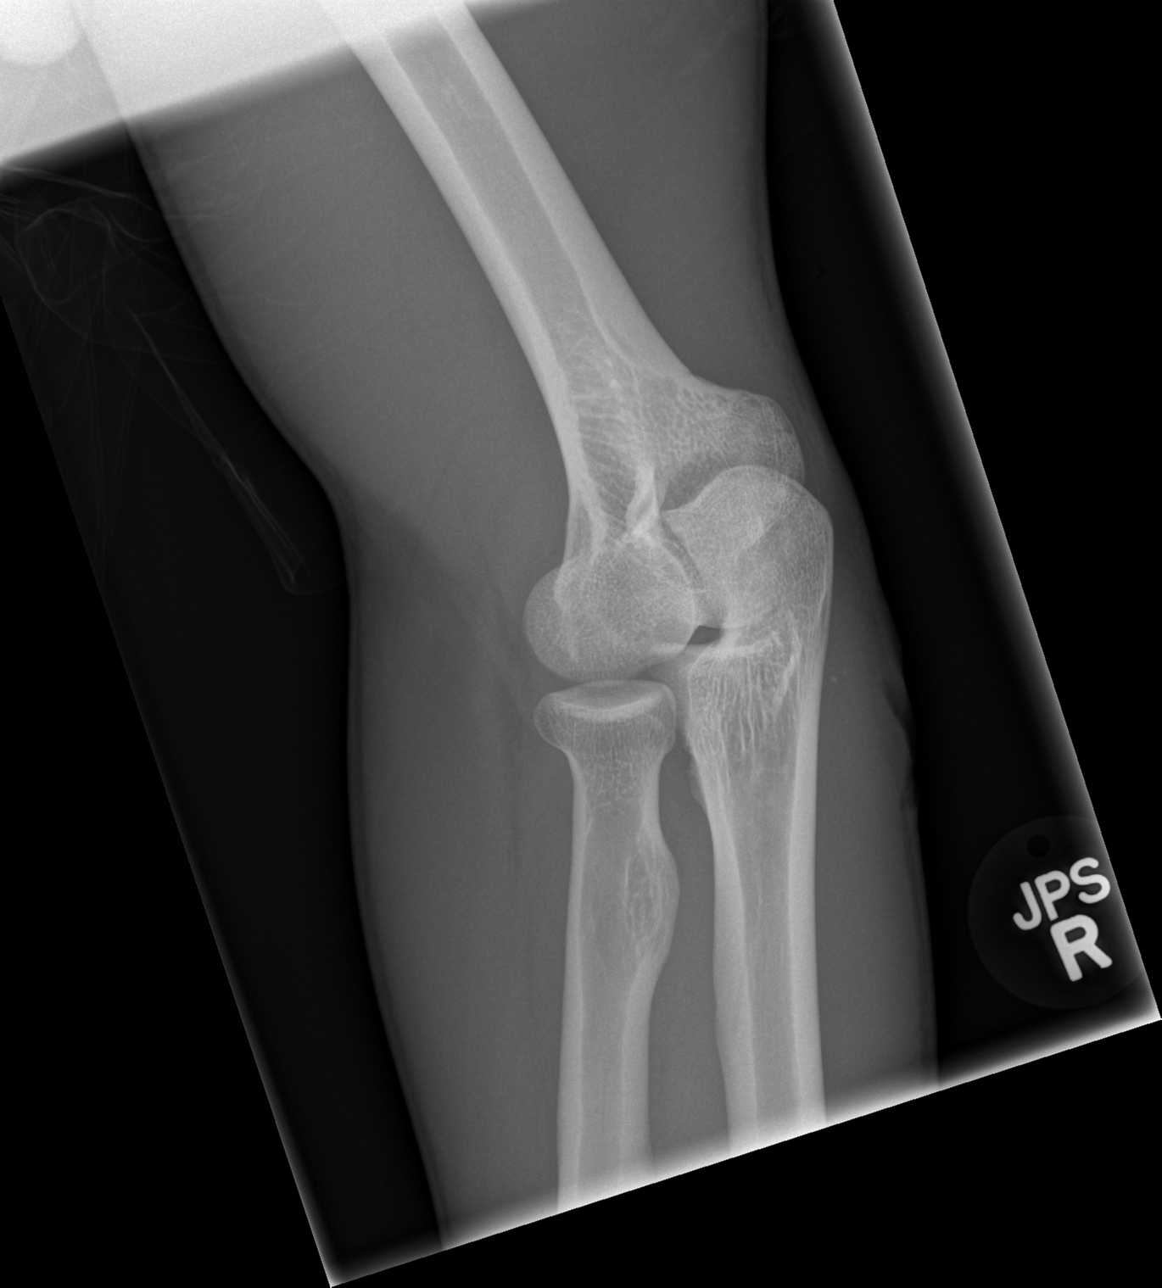

[x elbow obl right (2 of 3)]
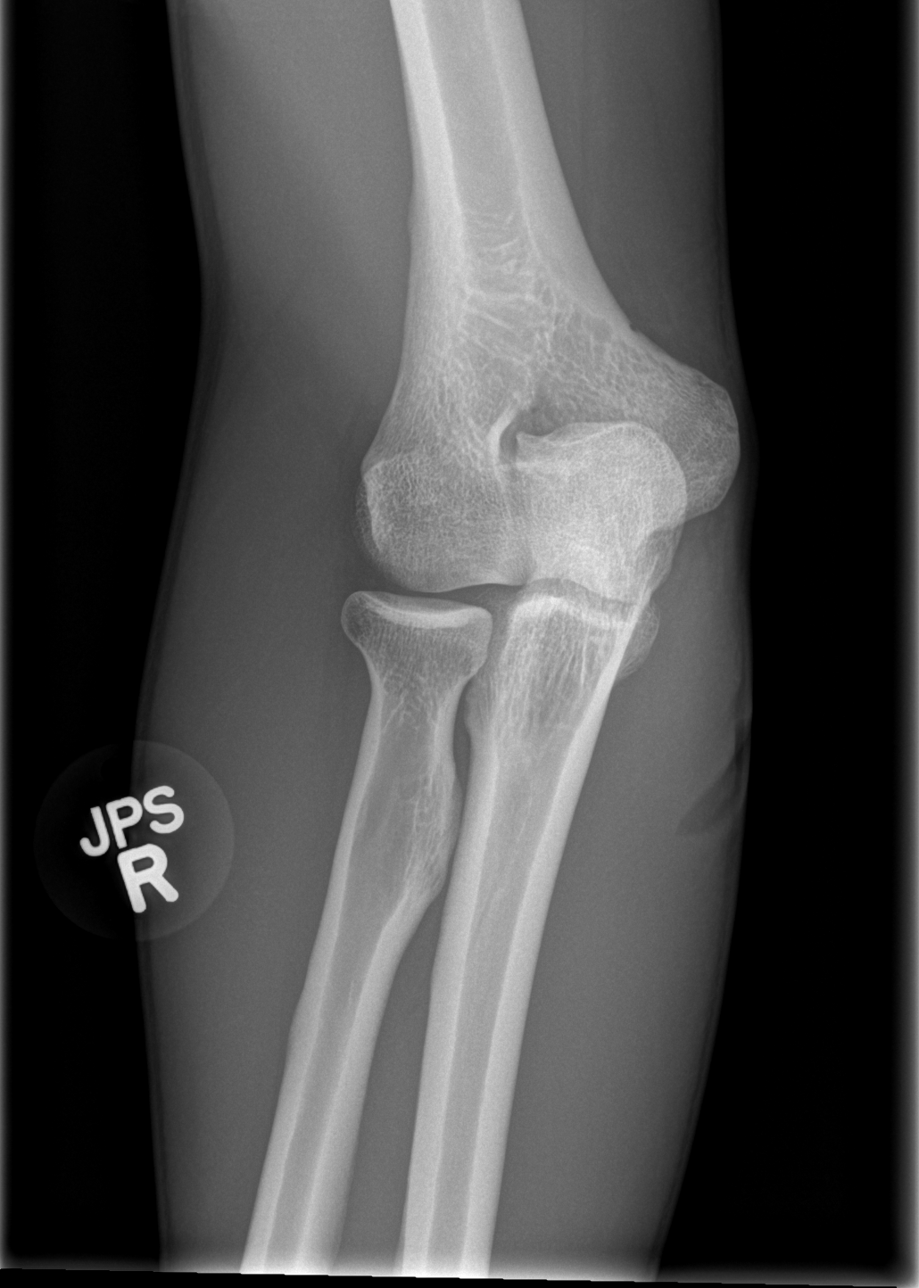

[x elbow obl right (3 of 3)]
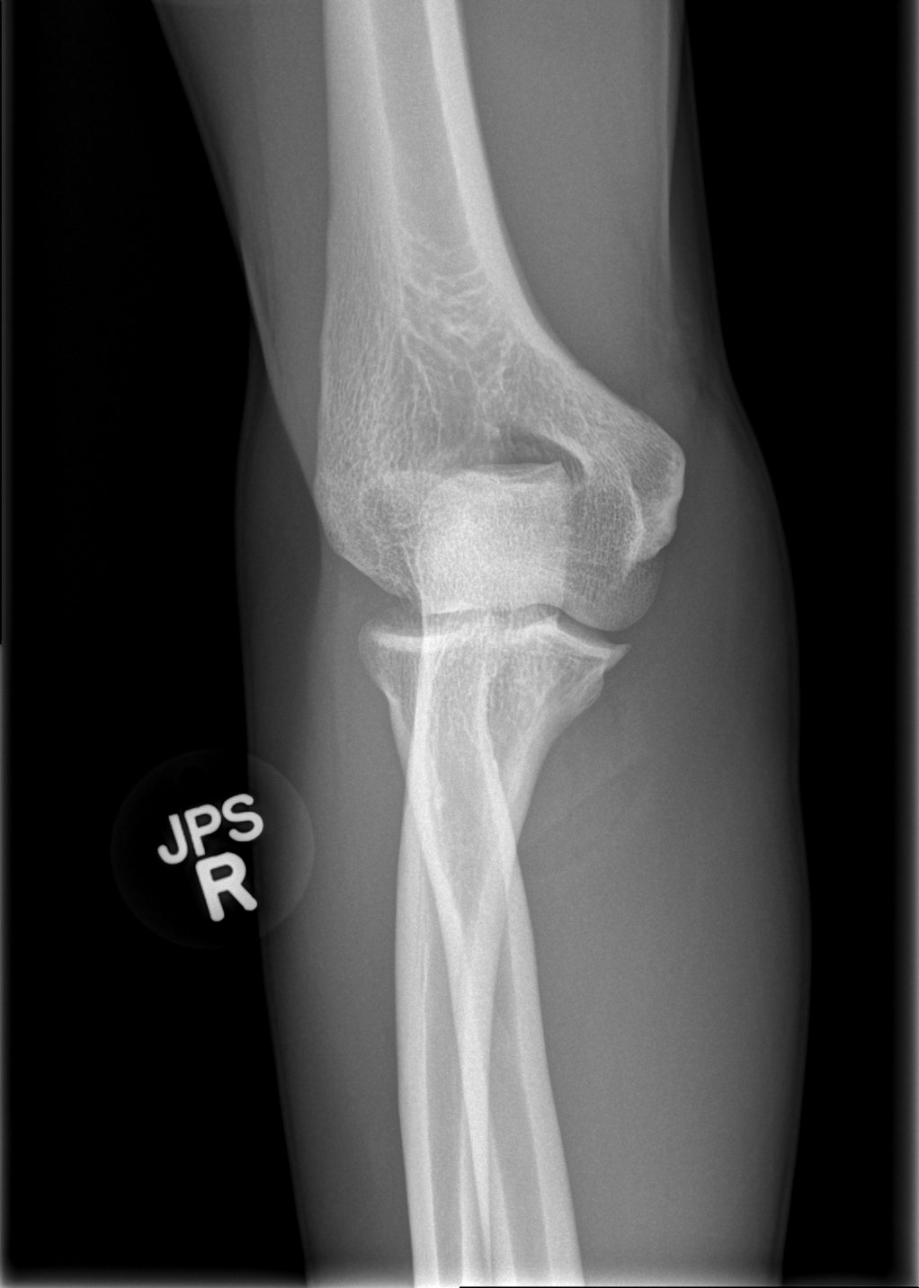

[x elbow lat right]
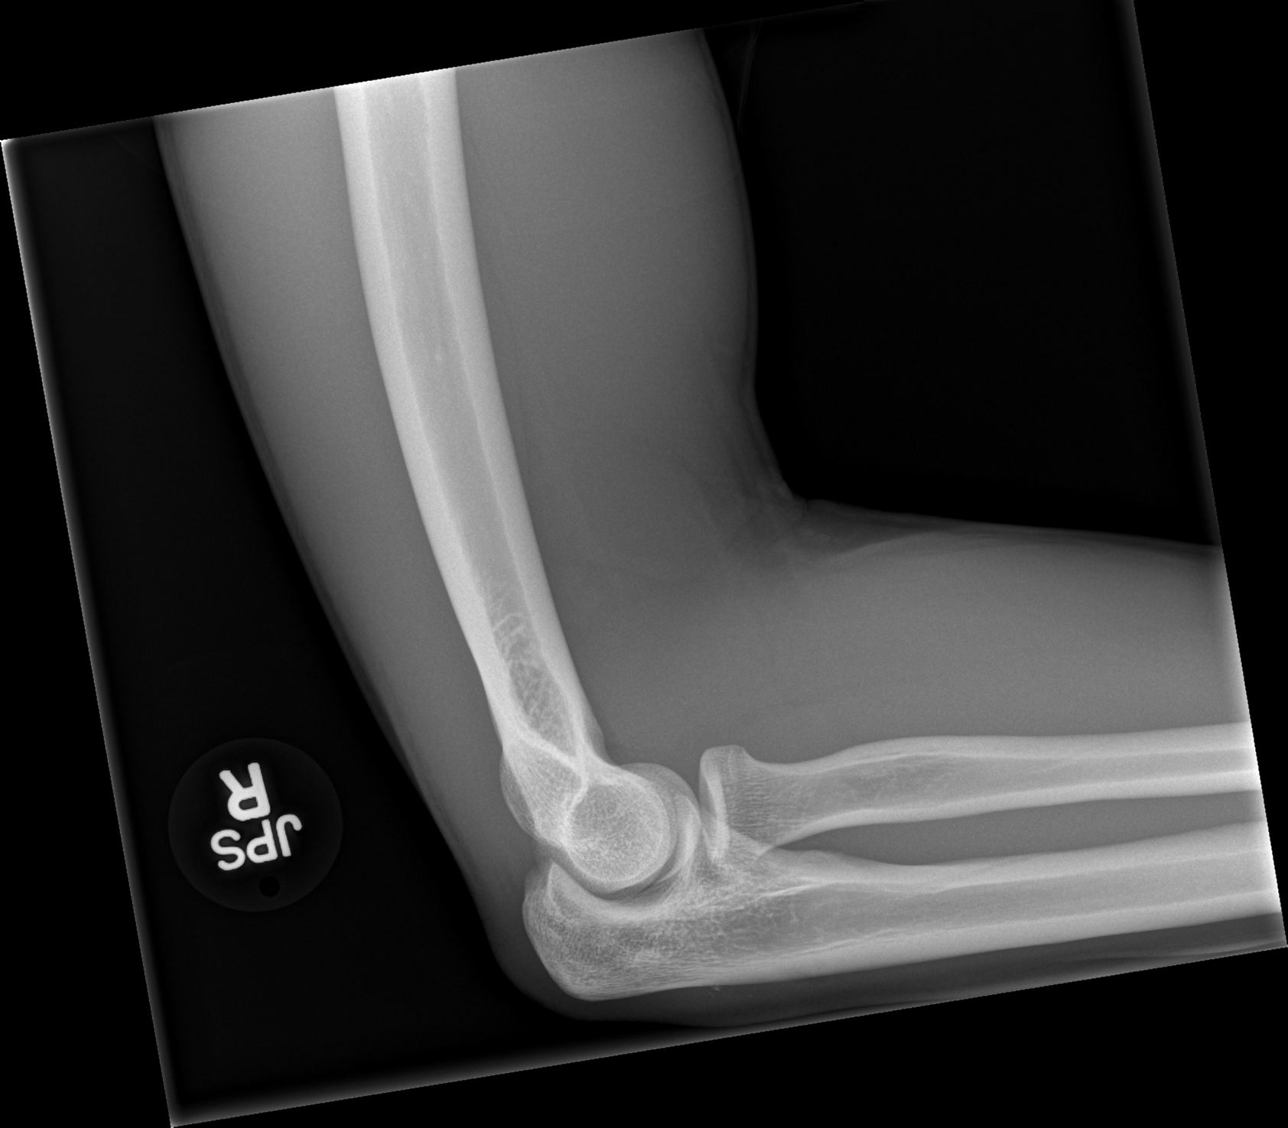

[4 of 4 positions shown; findings below may reference images not displayed]

FINDINGS: Soft tissue defect noted consistent with laceration
superficially.  No underlying radiopaque foreign body.  No
fracture.  Negative for effusion.
IMPRESSION: Superficial soft tissue laceration.  No other acute finding.

## 2015-01-17 ENCOUNTER — Emergency Department (HOSPITAL_COMMUNITY)
Admission: EM | Admit: 2015-01-17 | Discharge: 2015-01-21 | Disposition: E | Payer: Self-pay | Attending: Emergency Medicine | Admitting: Emergency Medicine

## 2015-01-17 DIAGNOSIS — S0121XA Laceration without foreign body of nose, initial encounter: Secondary | ICD-10-CM | POA: Insufficient documentation

## 2015-01-17 DIAGNOSIS — S0181XA Laceration without foreign body of other part of head, initial encounter: Secondary | ICD-10-CM | POA: Insufficient documentation

## 2015-01-17 DIAGNOSIS — Y92410 Unspecified street and highway as the place of occurrence of the external cause: Secondary | ICD-10-CM | POA: Insufficient documentation

## 2015-01-17 DIAGNOSIS — Y998 Other external cause status: Secondary | ICD-10-CM | POA: Insufficient documentation

## 2015-01-17 DIAGNOSIS — S0990XA Unspecified injury of head, initial encounter: Secondary | ICD-10-CM

## 2015-01-17 DIAGNOSIS — Y9389 Activity, other specified: Secondary | ICD-10-CM | POA: Insufficient documentation

## 2015-01-17 LAB — PREPARE FRESH FROZEN PLASMA
UNIT DIVISION: 0
Unit division: 0
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
## 2015-01-17 NOTE — ED Notes (Addendum)
TIME OF DEATH 2342
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
## 2015-01-18 ENCOUNTER — Encounter (HOSPITAL_COMMUNITY): Payer: Self-pay | Admitting: Emergency Medicine

## 2015-01-18 NOTE — Progress Notes (Signed)
   01/18/15 0020  Clinical Encounter Type  Visited With Other (Comment) (Patient unidentified at time of death)  Visit Type ED  Referral From Nurse  Chaplain responded to 2337 Level 1 Trauma call; patient pronounced dead soon after. No ID, no family notified. Chaplain left at 0018 after saying prayer at patient's side.

## 2015-01-18 NOTE — ED Notes (Addendum)
Eye care completed

## 2015-01-18 NOTE — ED Notes (Signed)
Transported to morgue by Rocky LinkKen, EMT
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
## 2015-01-18 NOTE — Progress Notes (Signed)
   01/18/15 0100  Clinical Encounter Type  Visited With Family  Visit Type ED;Death  Referral From Nurse  Spiritual Encounters  Spiritual Needs Grief support;Emotional;Other (Comment) (Liaison with doctors, police)  Stress Factors  Family Stress Factors Loss  Nurse called back about 12 to inform me that patient had been identified and family was on way. Met them in waiting room, mother not informed of death yet but already sobbing. Doctor told her, chaplain stayed with her while she contacted family. Pulled away to assist PEDS ED, returned to find out whether mother could see her son. She insisted against advice.

## 2015-01-18 NOTE — Code Documentation (Signed)
CSI is finished with pictures.  [employer] and [facility] state that pt's mother can view pt if she wishes.  Chaplain in with mother.  CSI states they do not need belongings.  Shirt, belt, pants, and socks at bedside and to go to morgue with pt per [facility]. No other valuables.

## 2015-01-20 ENCOUNTER — Encounter (HOSPITAL_COMMUNITY): Payer: Self-pay | Admitting: Emergency Medicine

## 2015-01-21 DEATH — deceased
